# Patient Record
Sex: Female | Born: 2005 | Race: Black or African American | Hispanic: No | Marital: Single | Smoking: Never smoker
Health system: Southern US, Community
[De-identification: ages and names within clinical notes are randomized; demographics above are authoritative.]

## PROBLEM LIST (undated history)

## (undated) DIAGNOSIS — K5900 Constipation, unspecified: Secondary | ICD-10-CM

## (undated) DIAGNOSIS — J984 Other disorders of lung: Secondary | ICD-10-CM

## (undated) DIAGNOSIS — J45909 Unspecified asthma, uncomplicated: Secondary | ICD-10-CM

## (undated) HISTORY — PX: ABDOMINAL SURGERY: SHX537

## (undated) HISTORY — PX: EYE SURGERY: SHX253

## (undated) HISTORY — PX: TONSILLECTOMY: SUR1361

---
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
## 2024-08-24 ENCOUNTER — Ambulatory Visit: Payer: Self-pay | Admitting: Nurse Practitioner

## 2024-08-24 ENCOUNTER — Ambulatory Visit
Admission: EM | Admit: 2024-08-24 | Discharge: 2024-08-24 | Disposition: A | Discharge status: Home / Self Care | Attending: Family Medicine | Admitting: Family Medicine

## 2024-08-24 ENCOUNTER — Ambulatory Visit

## 2024-08-24 ENCOUNTER — Encounter: Payer: Self-pay | Admitting: Emergency Medicine

## 2024-08-24 DIAGNOSIS — M79672 Pain in left foot: Secondary | ICD-10-CM | POA: Diagnosis not present

## 2024-08-24 NOTE — ED Triage Notes (Signed)
 Pt tripped and fell 1 week ago hitting her left foot against a basket. She c/o pain on the top of her left foot and left ankle.

## 2024-08-24 NOTE — Discharge Instructions (Signed)
 Use the Ace wrap as needed for swelling to help support the foot.  You may elevate and ice as needed.  Use over-the-counter Tylenol  or ibuprofen as needed for pain.  Follow-up with your PCP or orthopedics if your symptoms do not improve in 1 week.  Please go to the ER for any worsening symptoms.  Hope you feel better soon!

## 2024-08-24 NOTE — ED Provider Notes (Signed)
 " [facility]    CSN: [id number] Arrival date & time: 08/24/24  1746      History   Chief Complaint Chief Complaint  Patient presents with   Foot Pain    HPI Allison Rivas is a 19 y.o. female presents for foot pain.  Patient reports a week ago she hit the top of her left foot on a basket.  States she had a very firmly.  Since then she been having pain to the dorsal aspect of the foot and states it was also swollen at the time.  Pain now extends to the medial ankle.  No numbness or tingling.  She states she has sprained and broken this foot in the past.  No surgeries.  She has been doing Tylenol  and a foot brace without improvement.  No other concerns at this time.   Foot Pain    Past Medical History:  Diagnosis Date   Asthma    Chronic lung disease    Constipation     There are no active problems to display for this patient.   Past Surgical History:  Procedure Laterality Date   ABDOMINAL SURGERY     EYE SURGERY     TONSILLECTOMY      OB History   No obstetric history on file.      Home Medications    Prior to Admission medications  Medication Sig Start Date End Date Taking? Authorizing Provider  budesonide-formoterol (SYMBICORT) 80-4.5 MCG/ACT inhaler Inhale 2 puffs into the lungs. 04/18/24 04/18/25 Yes [provider]  Cholecalciferol 50 MCG (2000 UT) TABS Take 2,000 Units by mouth. 02/13/23  Yes [provider]  fluticasone (FLOVENT HFA) 44 MCG/ACT inhaler Inhale 2 puffs into the lungs. 08/07/21  Yes [provider]  beclomethasone (QVAR) 40 MCG/ACT inhaler Inhale 1 puff into the lungs 2 (two) times daily.    [provider]  levalbuterol (XOPENEX HFA) 45 MCG/ACT inhaler Inhale 1 puff into the lungs every 6 (six) hours as needed for wheezing.    [provider]  loratadine (CLARITIN) 10 MG tablet Take 10 mg by mouth daily.    [provider]  montelukast (SINGULAIR) 10 MG tablet Take 10 mg by  mouth at bedtime.    [provider]  naproxen  (NAPROSYN ) 375 MG tablet Take 1 tablet (375 mg total) by mouth 2 (two) times daily as needed for moderate pain (pain score 4-6). 12/03/23   Arvis Huxley B, PA-C  ondansetron (ZOFRAN) 4 MG tablet Take 4 mg by mouth every 8 (eight) hours as needed for nausea or vomiting.    [provider]  polyethylene glycol (MIRALAX / GLYCOLAX) 17 g packet Take 17 g by mouth daily.    [provider]  sertraline (ZOLOFT) 25 MG tablet Take 25 mg by mouth daily.    [provider]    Family History No family history on file.  Social History Social History[1]   Allergies   Albuterol and Corn-containing products   Review of Systems Review of Systems  Musculoskeletal:        Left foot pain      Physical Exam Triage Vital Signs ED Triage Vitals  Encounter Vitals Group     BP 08/24/24 1758 119/72     Girls Systolic BP Percentile --      Girls Diastolic BP Percentile --      Boys Systolic BP Percentile --      Boys Diastolic BP Percentile --  Pulse Rate 08/24/24 1758 93     Resp 08/24/24 1758 16     Temp 08/24/24 1758 98.6 F (37 C)     Temp Source 08/24/24 1758 Oral     SpO2 08/24/24 1758 99 %     Weight 08/24/24 1757 95 lb (43.1 kg)     Height --      Head Circumference --      Peak Flow --      Pain Score 08/24/24 1757 8     Pain Loc --      Pain Education --      Exclude from Growth Chart --    No data found.  Updated Vital Signs BP 119/72 (BP Location: Left Arm)   Pulse 93   Temp 98.6 F (37 C) (Oral)   Resp 16   Wt 95 lb (43.1 kg)   LMP 08/19/2024   SpO2 99%   BMI 16.31 kg/m   Visual Acuity Right Eye Distance:   Left Eye Distance:   Bilateral Distance:    Right Eye Near:   Left Eye Near:    Bilateral Near:     Physical Exam Vitals and nursing note reviewed.  Constitutional:      General: She is not in acute distress.    Appearance: Normal appearance. She is not  ill-appearing.  HENT:     Head: Normocephalic and atraumatic.  Eyes:     Pupils: Pupils are equal, round, and reactive to light.  Cardiovascular:     Rate and Rhythm: Normal rate.  Pulmonary:     Effort: Pulmonary effort is normal.  Musculoskeletal:       Legs:     Comments: There is no swelling or ecchymosis of the left foot.  Tender to palpation to the mid medial dorsal aspect of the foot that extends slightly into the medial malleolus.  Pain with dorsi flexion and extension.  DP +2.  Skin:    General: Skin is warm and dry.  Neurological:     General: No focal deficit present.     Mental Status: She is alert and oriented to person, place, and time.  Psychiatric:        Mood and Affect: Mood normal.        Behavior: Behavior normal.      UC Treatments / Results  Labs (all labs ordered are listed, but only abnormal results are displayed) Labs Reviewed - No data to display  EKG   Radiology DG Foot Complete Left Result Date: 08/24/2024 CLINICAL DATA:  Trauma to the left foot. EXAM: LEFT FOOT - COMPLETE 3+ VIEW COMPARISON:  None Available. FINDINGS: There is no evidence of fracture or dislocation. There is no evidence of arthropathy or other focal bone abnormality. Soft tissues are unremarkable. IMPRESSION: Negative. Electronically Signed   By: Vanetta Chou M.D.   On: 08/24/2024 18:24    Procedures Procedures (including critical care time)  Medications Ordered in UC Medications - No data to display  Initial Impression / Assessment and Plan / UC Course  I have reviewed the triage vital signs and the nursing notes.  Pertinent labs & imaging results that were available during my care of the patient were reviewed by me and considered in my medical decision making (see chart for details).     X-ray negative for fracture.  Discussed soft tissue injury/contusion and reviewed RICE therapy.  Ace wrap applied in clinic.  OTC analgesics as needed.  PCP or Ortho follow-up in 1  week if symptoms do not improve.  ER precautions reviewed. Final Clinical Impressions(s) / UC Diagnoses   Final diagnoses:  Left foot pain     Discharge Instructions      Use the Ace wrap as needed for swelling to help support the foot.  You may elevate and ice as needed.  Use over-the-counter Tylenol  or ibuprofen as needed for pain.  Follow-up with your PCP or orthopedics if your symptoms do not improve in 1 week.  Please go to the ER for any worsening symptoms.  Hope you feel better soon!     ED Prescriptions   None    PDMP not reviewed this encounter.     [1]  Social History Tobacco Use   Smoking status: Never   Smokeless tobacco: Never  Vaping Use   Vaping status: Never Used  Substance Use Topics   Alcohol use: No   Drug use: No     Loreda Myla SAUNDERS, NP 08/24/24 1830  "
